# Patient Record
Sex: Female | Born: 1990 | Race: White | Hispanic: No | Marital: Single
Health system: Southern US, Community
[De-identification: ages and names within clinical notes are randomized; demographics above are authoritative.]

## PROBLEM LIST (undated history)

## (undated) DIAGNOSIS — I499 Cardiac arrhythmia, unspecified: Secondary | ICD-10-CM

## (undated) DIAGNOSIS — I471 Supraventricular tachycardia: Secondary | ICD-10-CM

## (undated) HISTORY — DX: Cardiac arrhythmia, unspecified: I49.9

## (undated) HISTORY — DX: Supraventricular tachycardia: I47.1

---
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
## 2017-12-04 ENCOUNTER — Telehealth: Payer: Self-pay

## 2017-12-04 NOTE — Telephone Encounter (Signed)
error
